# Patient Record
Sex: Female | Born: 2000 | Hispanic: Yes | Marital: Single
Health system: Southern US, Community
[De-identification: ages and names within clinical notes are randomized; demographics above are authoritative.]

---
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
## 2022-07-31 ENCOUNTER — Emergency Department
Admission: EM | Admit: 2022-07-31 | Discharge: 2022-07-31 | Disposition: A | Discharge status: Home / Self Care | Payer: Medicaid Other | Attending: Student in an Organized Health Care Education/Training Program | Admitting: Student in an Organized Health Care Education/Training Program

## 2022-07-31 ENCOUNTER — Emergency Department: Payer: Medicaid Other

## 2022-07-31 DIAGNOSIS — R059 Cough, unspecified: Secondary | ICD-10-CM | POA: Diagnosis present

## 2022-07-31 DIAGNOSIS — J209 Acute bronchitis, unspecified: Secondary | ICD-10-CM | POA: Diagnosis not present

## 2022-07-31 LAB — CBC
HCT: 38.2 % (ref 36.0–46.0)
Hemoglobin: 12.8 g/dL (ref 12.0–15.0)
MCH: 32.6 pg (ref 26.0–34.0)
MCHC: 33.5 g/dL (ref 30.0–36.0)
MCV: 97.2 fL (ref 80.0–100.0)
Platelets: 288 10*3/uL (ref 150–400)
RBC: 3.93 MIL/uL (ref 3.87–5.11)
RDW: 13.7 % (ref 11.5–15.5)
WBC: 8.2 10*3/uL (ref 4.0–10.5)
nRBC: 0 % (ref 0.0–0.2)

## 2022-07-31 LAB — BASIC METABOLIC PANEL
Anion gap: 9 (ref 5–15)
BUN: 9 mg/dL (ref 6–20)
CO2: 22 mmol/L (ref 22–32)
Calcium: 8.8 mg/dL — ABNORMAL LOW (ref 8.9–10.3)
Chloride: 103 mmol/L (ref 98–111)
Creatinine, Ser: 0.56 mg/dL (ref 0.44–1.00)
GFR, Estimated: >60 mL/min (ref >60)
Glucose, Bld: 94 mg/dL (ref 70–99)
Potassium: 3.6 mmol/L (ref 3.5–5.1)
Sodium: 134 mmol/L — ABNORMAL LOW (ref 135–145)

## 2022-07-31 LAB — TROPONIN I (HIGH SENSITIVITY): Troponin I (High Sensitivity): <2 ng/L (ref <18)

## 2022-07-31 MED ORDER — PSEUDOEPH-BROMPHEN-DM 30-2-10 MG/5ML PO SYRP: 10.0000 mL | ORAL_SOLUTION | Freq: Four times a day (QID) | ORAL | 0 refills | Status: AC | PRN

## 2022-07-31 MED ORDER — BENZONATATE 100 MG PO CAPS: 100.0000 mg | ORAL_CAPSULE | Freq: Three times a day (TID) | ORAL | 0 refills | Status: AC | PRN

## 2022-07-31 MED ORDER — PREDNISONE 50 MG PO TABS: 50.0000 mg | ORAL_TABLET | Freq: Every day | ORAL | 0 refills | Status: AC

## 2022-07-31 MED ORDER — ALBUTEROL SULFATE HFA 108 (90 BASE) MCG/ACT IN AERS: 2.0000 | INHALATION_SPRAY | Freq: Four times a day (QID) | RESPIRATORY_TRACT | 0 refills | Status: AC | PRN

## 2022-07-31 NOTE — ED Triage Notes (Signed)
Per mother, pt recently had covid and has been having a cough ever since.

## 2022-07-31 NOTE — ED Provider Notes (Signed)
[facility] Provider Note  Patient Contact: 3:51 PM (approximate)   History   Cough   HPI  Teresa Herman is a 22 y.o. female who presents the emergency department with ongoing cough.  Mother reports that last week patient had congestion, cough and the cough is persisting.  She states that she is having coughing "fits" at nighttime.  No true shortness of breath.  No fevers, other symptoms at this time.     Physical Exam   Triage Vital Signs: ED Triage Vitals  Enc Vitals Group     BP 07/31/22 1412 105/64     Pulse Rate 07/31/22 1412 71     Resp 07/31/22 1412 18     Temp 07/31/22 1412 (!) 97.5 F (36.4 C)     Temp Source 07/31/22 1412 Oral     SpO2 07/31/22 1412 96 %     Weight 07/31/22 1413 162 lb (73.5 kg)     Height 07/31/22 1413 5' (1.524 m)     Head Circumference --      Peak Flow --      Pain Score --      Pain Loc --      Pain Edu? --      Excl. in GC? --     Most recent vital signs: Vitals:   07/31/22 1412  BP: 105/64  Pulse: 71  Resp: 18  Temp: (!) 97.5 F (36.4 C)  SpO2: 96%     General: Alert and in no acute distress. ENT:      Ears:       Nose: No congestion/rhinnorhea.      Mouth/Throat: Mucous membranes are moist. Neck: No stridor. No cervical spine tenderness to palpation  Cardiovascular:  Good peripheral perfusion Respiratory: Normal respiratory effort without tachypnea or retractions. Lungs CTAB. Good air entry to the bases with no decreased or absent breath sounds. Musculoskeletal: Full range of motion to all extremities.  Neurologic:  No gross focal neurologic deficits are appreciated.  Skin:   No rash noted Other:   ED Results / Procedures / Treatments   Labs (all labs ordered are listed, but only abnormal results are displayed) Labs Reviewed  BASIC METABOLIC PANEL - Abnormal; Notable for the following components:      Result Value   Sodium 134 (*)    Calcium 8.8 (*)    All other components  within normal limits  CBC  POC URINE PREG, ED  TROPONIN I (HIGH SENSITIVITY)     EKG  ED ECG REPORT I, Delorise Royals Donis Pinder,  personally viewed and interpreted this ECG.   Date: 07/31/2022  EKG Time: 1408 hrs.  Rate: 69 bpm  Rhythm: there are no previous tracings available for comparison, normal sinus rhythm, sinus arrhythmia  Axis: Normal axis  Intervals:none  ST&T Change: No ST elevation or depression noted  Normal sinus rhythm with sinus arrhythmia.  No STEMI.  No previous EKGs for comparison.    RADIOLOGY  I personally viewed, evaluated, and interpreted these images as part of my medical decision making, as well as reviewing the written report by the radiologist.  ED Provider Interpretation: No acute consolidation on x-ray to be concerning for pneumonia.  DG Chest 2 View  Result Date: 07/31/2022 CLINICAL DATA:  Cough EXAM: CHEST - 2 VIEW COMPARISON:  04/03/2007 FINDINGS: No consolidation, pneumothorax or effusion. Normal cardiopericardial silhouette without edema. IMPRESSION: No acute cardiopulmonary disease. Electronically Signed   By: Piedad Climes.D.  On: 07/31/2022 14:28    PROCEDURES:  Critical Care performed: No  Procedures   MEDICATIONS ORDERED IN ED: Medications - No data to display   IMPRESSION / MDM / ASSESSMENT AND PLAN / ED COURSE  I reviewed the triage vital signs and the nursing notes.                                 Differential diagnosis includes, but is not limited to, costochondritis, pneumonia, bronchitis, viral illness, COVID, flu   Patient's presentation is most consistent with acute presentation with potential threat to life or bodily function.   Patient's diagnosis is consistent with bronchitis.  Patient presents emergency department with mother for complaint of coughing.  Started with viral symptoms last week, has largely resolved with the exception of a dry hacking cough.  No consolidation on x-ray.  No fevers.  This time suspect  bronchitis based off exam, results.  Will treat with a short course of steroid, albuterol, cough medication.  Follow-up with primary care as needed..  Patient is given ED precautions to return to the ED for any worsening or new symptoms.     FINAL CLINICAL IMPRESSION(S) / ED DIAGNOSES   Final diagnoses:  Acute bronchitis, unspecified organism     Rx / DC Orders   ED Discharge Orders          Ordered    predniSONE (DELTASONE) 50 MG tablet  Daily with breakfast        07/31/22 1626    albuterol (VENTOLIN HFA) 108 (90 Base) MCG/ACT inhaler  Every 6 hours PRN        07/31/22 1626    benzonatate (TESSALON PERLES) 100 MG capsule  3 times daily PRN        07/31/22 1626    brompheniramine-pseudoephedrine-DM 30-2-10 MG/5ML syrup  4 times daily PRN        07/31/22 1626             Note:  This document was prepared using Dragon voice recognition software and may include unintentional dictation errors.   Lanette Hampshire 07/31/22 1626    Willy Eddy, MD 07/31/22 Jerene Bears
